# Patient Record
Sex: Male | Born: 1960 | Hispanic: No | Marital: Single | Smoking: Never smoker
Health system: Southern US, Community
[De-identification: ages and names within clinical notes are randomized; demographics above are authoritative.]

## PROBLEM LIST (undated history)

## (undated) DIAGNOSIS — Z8619 Personal history of other infectious and parasitic diseases: Secondary | ICD-10-CM

## (undated) HISTORY — DX: Personal history of other infectious and parasitic diseases: Z86.19

## (undated) HISTORY — PX: TONSILLECTOMY: SUR1361

---

## 1999-01-22 HISTORY — PX: LASIK: SHX215

## 2004-02-02 ENCOUNTER — Encounter: Admission: RE | Admit: 2004-02-02 | Discharge: 2004-03-02 | Payer: Self-pay | Admitting: Orthopaedic Surgery

## 2014-07-08 ENCOUNTER — Other Ambulatory Visit: Payer: Self-pay | Admitting: Physician Assistant

## 2014-07-08 DIAGNOSIS — N5082 Scrotal pain: Secondary | ICD-10-CM

## 2014-07-12 ENCOUNTER — Ambulatory Visit: Payer: 59

## 2014-07-12 ENCOUNTER — Other Ambulatory Visit: Payer: Self-pay | Admitting: Physician Assistant

## 2014-07-12 ENCOUNTER — Ambulatory Visit (INDEPENDENT_AMBULATORY_CARE_PROVIDER_SITE_OTHER): Payer: 59

## 2014-07-12 DIAGNOSIS — N508 Other specified disorders of male genital organs: Secondary | ICD-10-CM

## 2014-07-12 DIAGNOSIS — N5082 Scrotal pain: Secondary | ICD-10-CM

## 2014-07-13 ENCOUNTER — Other Ambulatory Visit: Payer: Self-pay

## 2016-01-22 DIAGNOSIS — Z8619 Personal history of other infectious and parasitic diseases: Secondary | ICD-10-CM

## 2016-01-22 HISTORY — DX: Personal history of other infectious and parasitic diseases: Z86.19

## 2016-02-05 DIAGNOSIS — B029 Zoster without complications: Secondary | ICD-10-CM | POA: Diagnosis not present

## 2016-02-23 DIAGNOSIS — B0223 Postherpetic polyneuropathy: Secondary | ICD-10-CM | POA: Diagnosis not present

## 2016-06-25 IMAGING — US US SCROTUM
1 series · 14 of 25 positions shown · non-contrast
Comparison: None.

CLINICAL DATA: Intermittent dull bilateral testicular pain.
Symptoms for 2-3 months.

EXAM:
SCROTAL ULTRASOUND
DOPPLER ULTRASOUND OF THE TESTICLES
TECHNIQUE: Complete ultrasound examination of the testicles, epididymis, and
other scrotal structures was performed. Color and spectral Doppler
ultrasound were also utilized to evaluate blood flow to the
testicles.

[Series 1: us scrotum · 0.07mm/px · 14 of 91 slices shown]
[im 1/91]
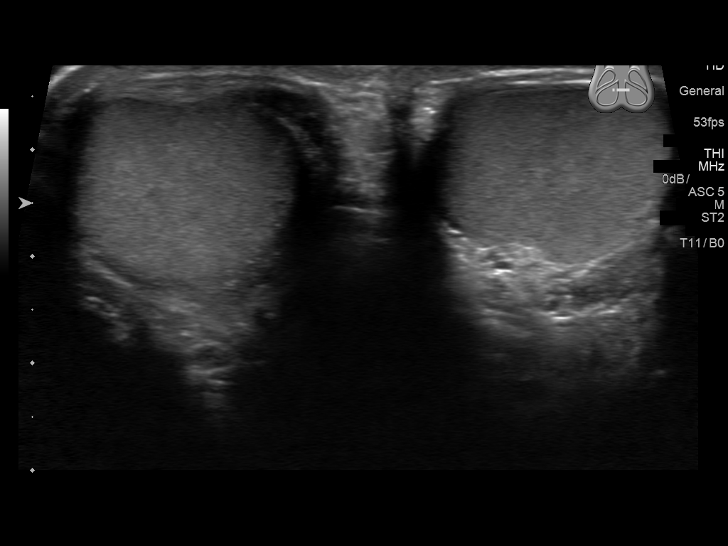
[im 8/91]
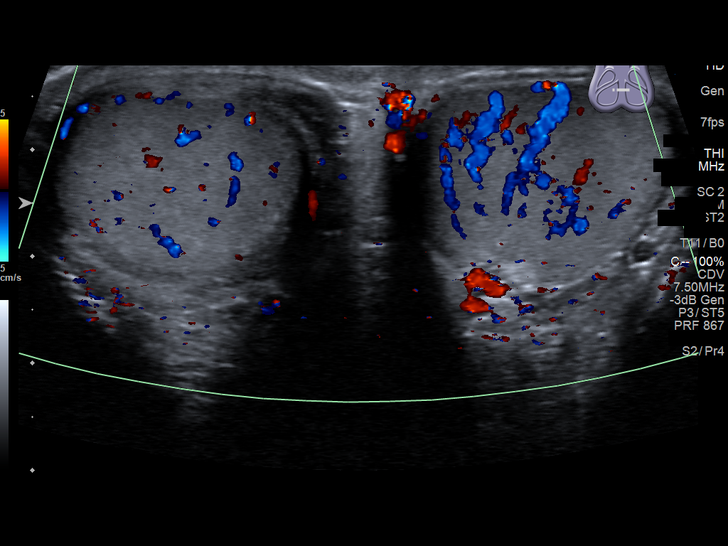
[im 16/91]
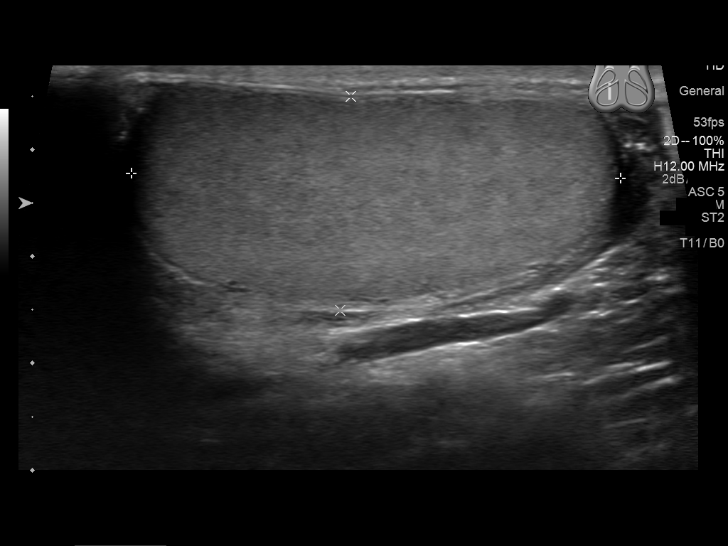
[im 23/91]
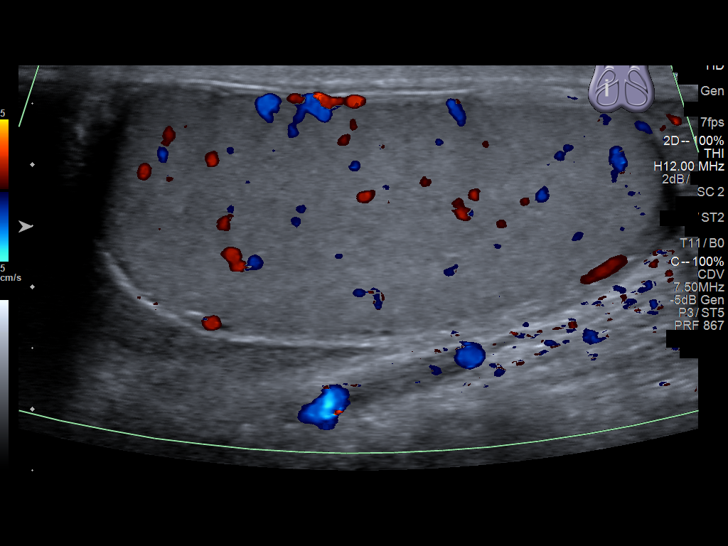
[im 31/91]
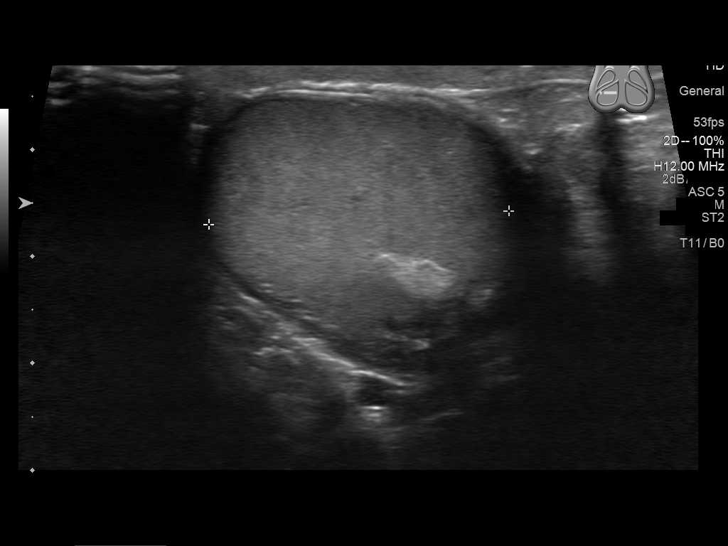
[im 34/91]
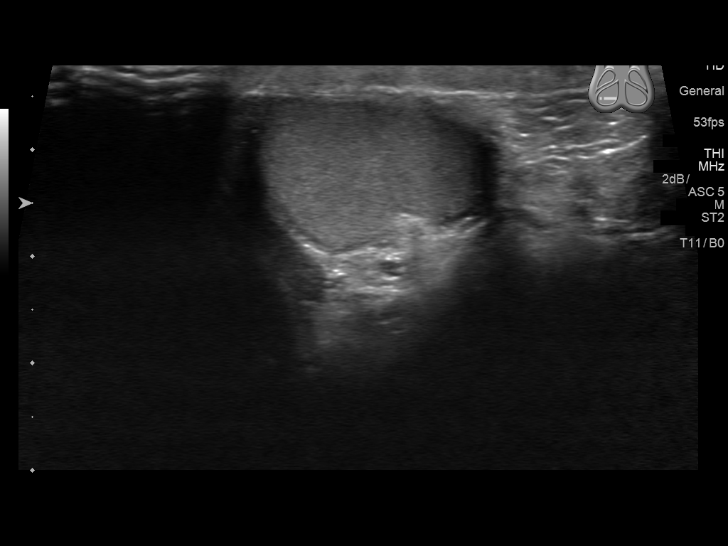
[im 42/91]
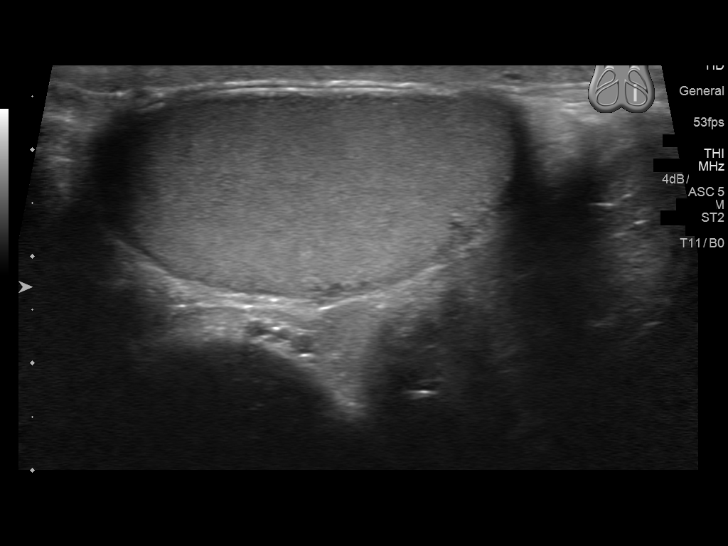
[im 49/91]
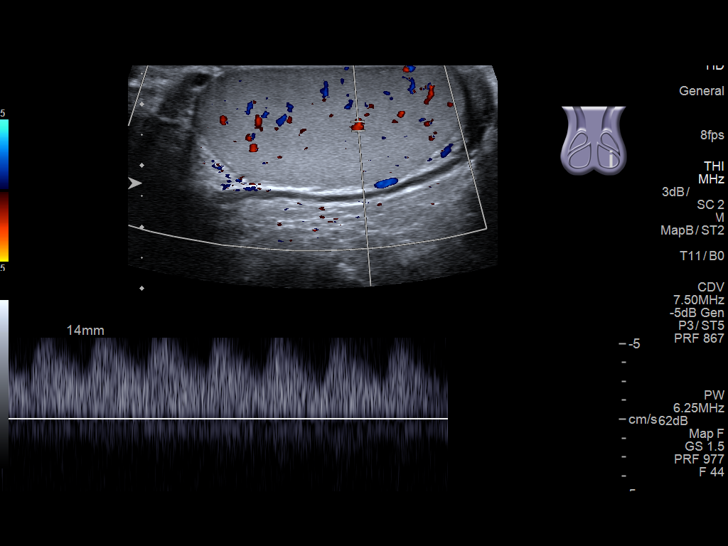
[im 57/91]
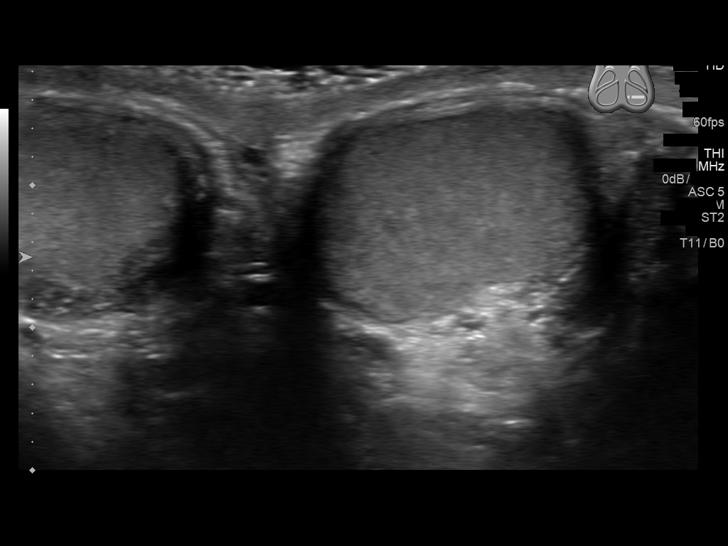
[im 61/91]
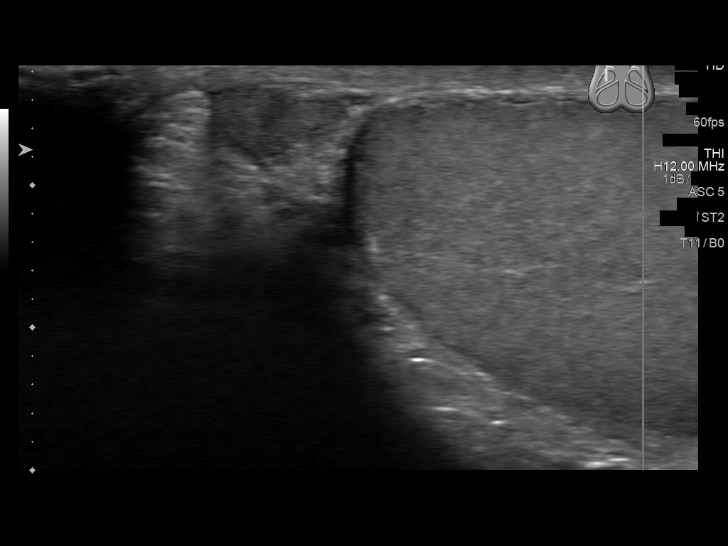
[im 68/91]
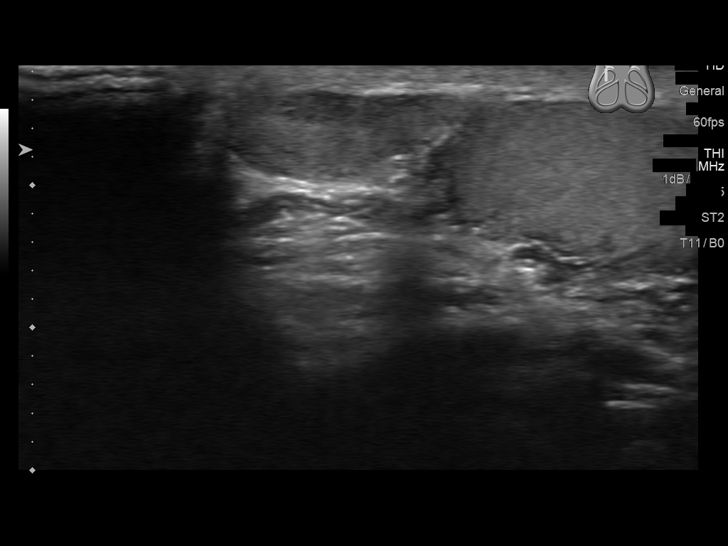
[im 76/91]
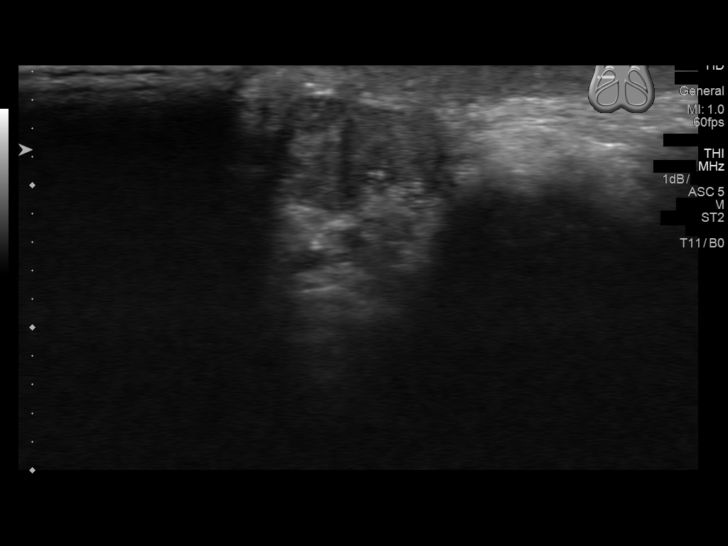
[im 83/91]
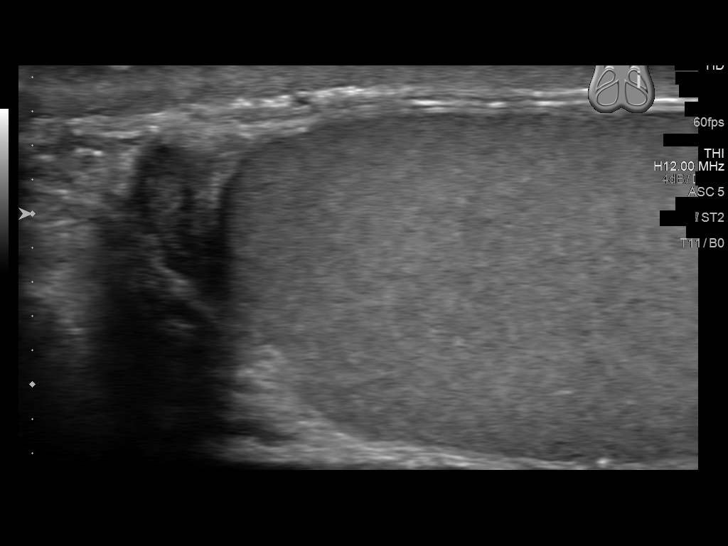
[im 91/91]
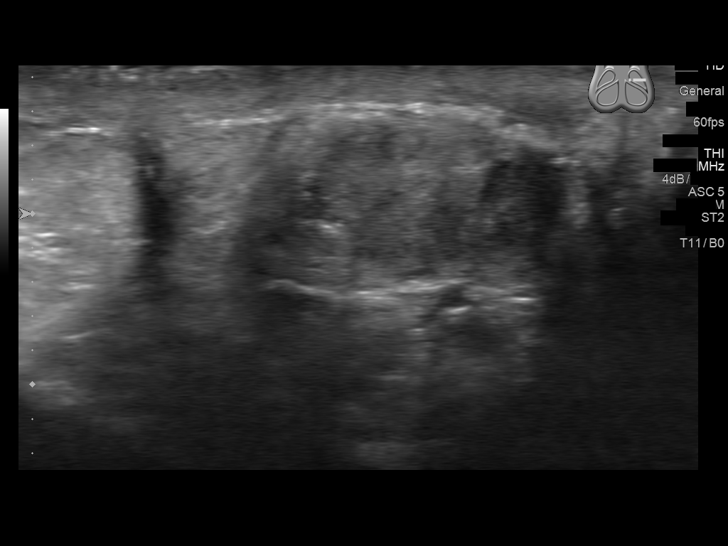

[14 of 25 positions shown; findings below may reference images not displayed]

FINDINGS: Right testicle

Measurements: 4.6 x 2.0 x 2.8 cm. No mass or microlithiasis
visualized.

Left testicle

Measurements: 4.3 x 2.3 x 2.4 cm. No mass or microlithiasis
visualized.

Right epididymis:  Normal in size and appearance.

Left epididymis:  Normal in size and appearance.

Hydrocele:  None visualized.

Varicocele:  None visualized.

Pulsed Doppler interrogation of both testes demonstrates normal low
resistance arterial and venous waveforms bilaterally.
IMPRESSION: Unremarkable testicular ultrasound.

## 2016-07-22 ENCOUNTER — Encounter: Payer: Self-pay | Admitting: Gastroenterology

## 2016-09-26 ENCOUNTER — Ambulatory Visit (AMBULATORY_SURGERY_CENTER): Payer: Self-pay | Admitting: *Deleted

## 2016-09-26 ENCOUNTER — Encounter: Payer: Self-pay | Admitting: Gastroenterology

## 2016-09-26 VITALS — Ht 72.0 in | Wt 200.0 lb

## 2016-09-26 DIAGNOSIS — Z1211 Encounter for screening for malignant neoplasm of colon: Secondary | ICD-10-CM

## 2016-09-26 MED ORDER — NA SULFATE-K SULFATE-MG SULF 17.5-3.13-1.6 GM/177ML PO SOLN: ORAL | 0 refills | Status: DC

## 2016-09-26 NOTE — Progress Notes (Signed)
Patient denies any allergies to eggs or soy. Patient denies any problems with anesthesia/sedation. Patient denies any oxygen use at home and does not take any diet/weight loss medications. EMMI education assisgned to patient on colonoscopy, this was explained and instructions given to patient. 

## 2016-10-10 ENCOUNTER — Encounter: Payer: Self-pay | Admitting: Gastroenterology

## 2016-10-10 ENCOUNTER — Ambulatory Visit (AMBULATORY_SURGERY_CENTER): Payer: 59 | Admitting: Gastroenterology

## 2016-10-10 VITALS — BP 116/79 | HR 63 | Temp 96.8°F | Resp 17 | Ht 72.0 in | Wt 200.0 lb

## 2016-10-10 DIAGNOSIS — Z1211 Encounter for screening for malignant neoplasm of colon: Secondary | ICD-10-CM

## 2016-10-10 DIAGNOSIS — Z1212 Encounter for screening for malignant neoplasm of rectum: Secondary | ICD-10-CM

## 2016-10-10 MED ORDER — SODIUM CHLORIDE 0.9 % IV SOLN: 500.0000 mL | INTRAVENOUS | Status: AC

## 2016-10-10 NOTE — Progress Notes (Signed)
To PACU VSS. Report to [email or web address] 

## 2016-10-10 NOTE — Progress Notes (Signed)
Pt's states no medical or surgical changes since previsit or office visit. 

## 2016-10-10 NOTE — Patient Instructions (Signed)

## 2016-10-10 NOTE — Op Note (Signed)
[facility] Patient Name: Benjamin Travis Procedure Date: 10/10/2016 9:23 AM MRN: [id number] Endoscopist: Napoleon Form , MD Age: 56 Referring MD:  Date of Birth: 17-Jun-1960 Gender: Male Account #: [id number] Procedure:                Colonoscopy Indications:              Screening for colorectal malignant neoplasm Medicines:                Monitored Anesthesia Care Procedure:                Pre-Anesthesia Assessment:                           - Prior to the procedure, a History and Physical                            was performed, and patient medications and                            allergies were reviewed. The patient's tolerance of                            previous anesthesia was also reviewed. The risks                            and benefits of the procedure and the sedation                            options and risks were discussed with the patient.                            All questions were answered, and informed consent                            was obtained. Prior Anticoagulants: The patient has                            taken no previous anticoagulant or antiplatelet                            agents. ASA Grade Assessment: I - A normal, healthy                            patient. After reviewing the risks and benefits,                            the patient was deemed in satisfactory condition to                            undergo the procedure.                           After obtaining informed consent, the colonoscope  was passed under direct vision. Throughout the                            procedure, the patient's blood pressure, pulse, and                            oxygen saturations were monitored continuously. The                            Colonoscope was introduced through the anus and                            advanced to the the cecum, identified by                            appendiceal orifice and ileocecal  valve. The                            colonoscopy was performed without difficulty. The                            patient tolerated the procedure well. The quality                            of the bowel preparation was excellent. The                            ileocecal valve, appendiceal orifice, and rectum                            were photographed. Scope In: 9:26:15 AM Scope Out: 9:45:10 AM Scope Withdrawal Time: 0 hours 12 minutes 52 seconds  Total Procedure Duration: 0 hours 18 minutes 55 seconds  Findings:                 The perianal and digital rectal examinations were                            normal.                           Non-bleeding internal hemorrhoids were found during                            retroflexion. The hemorrhoids were small.                           The exam was otherwise without abnormality. Complications:            No immediate complications. Estimated Blood Loss:     Estimated blood loss: none. Impression:               - Non-bleeding internal hemorrhoids.                           - The examination was otherwise normal.                           -  No specimens collected. Recommendation:           - Patient has a contact number available for                            emergencies. The signs and symptoms of potential                            delayed complications were discussed with the                            patient. Return to normal activities tomorrow.                            Written discharge instructions were provided to the                            patient.                           - Resume previous diet.                           - Continue present medications.                           - Repeat colonoscopy in 10 years for screening                            purposes. Napoleon Form, MD 10/10/2016 9:50:15 AM This report has been signed electronically.

## 2016-10-11 ENCOUNTER — Telehealth: Payer: Self-pay | Admitting: *Deleted

## 2016-10-11 NOTE — Telephone Encounter (Signed)
  Follow up Call-  Call back number 10/10/2016  Post procedure Call Back phone  # [phone]  Permission to leave phone message Yes  Some recent data might be hidden     Patient questions:  Do you have a fever, pain , or abdominal swelling? No. Pain Score  0 *  Have you tolerated food without any problems? Yes.    Have you been able to return to your normal activities? Yes.    Do you have any questions about your discharge instructions: Diet   No. Medications  No. Follow up visit  No.  Do you have questions or concerns about your Care? No.  Actions: * If pain score is 4 or above: No action needed, pain <4.

## 2016-11-18 DIAGNOSIS — Z23 Encounter for immunization: Secondary | ICD-10-CM | POA: Diagnosis not present

## 2016-12-05 DIAGNOSIS — Z23 Encounter for immunization: Secondary | ICD-10-CM | POA: Diagnosis not present

## 2017-01-27 ENCOUNTER — Ambulatory Visit (INDEPENDENT_AMBULATORY_CARE_PROVIDER_SITE_OTHER): Payer: 59 | Admitting: Orthopaedic Surgery

## 2017-01-27 ENCOUNTER — Ambulatory Visit (INDEPENDENT_AMBULATORY_CARE_PROVIDER_SITE_OTHER): Payer: 59

## 2017-01-27 ENCOUNTER — Encounter (INDEPENDENT_AMBULATORY_CARE_PROVIDER_SITE_OTHER): Payer: Self-pay | Admitting: Orthopaedic Surgery

## 2017-01-27 DIAGNOSIS — M25511 Pain in right shoulder: Secondary | ICD-10-CM

## 2017-01-27 MED ORDER — NABUMETONE 500 MG PO TABS: 500.0000 mg | ORAL_TABLET | Freq: Two times a day (BID) | ORAL | 1 refills | Status: AC | PRN

## 2017-01-27 MED ORDER — LIDOCAINE HCL 1 % IJ SOLN
3.0000 mL | INTRAMUSCULAR | Status: AC | PRN
  Administered 2017-01-27: 3 mL

## 2017-01-27 MED ORDER — METHYLPREDNISOLONE ACETATE 40 MG/ML IJ SUSP
40.0000 mg | INTRAMUSCULAR | Status: AC | PRN
  Administered 2017-01-27: 40 mg via INTRA_ARTICULAR

## 2017-01-27 NOTE — Progress Notes (Signed)
Office Visit Note   Patient: Benjamin HooseDennis Lemieux           Date of Birth: 07/31/1960           MRN: [id number] Visit Date: 01/27/2017              Requested by: Lovenia KimHepler, Mark, PA-C [id number] [place], [place] [address] PCP: Lovenia KimHepler, Mark, PA-C   Assessment & Plan: Visit Diagnoses:  1. Acute pain of right shoulder     Plan: Given the fact that a steroid injections help with his left side in the past he like to have this in the right shoulder and I agree with this.  He understands fully the risk and benefits of steroid injections.  He tolerated the injection well.  We will also try some Relafen as an anti-inflammatory.  He will follow-up as needed if things are not getting better he would let us know because I would then obtain an MRI if needed.  Follow-Up Instructions: Return if symptoms worsen or fail to improve.   Orders:  Orders Placed This Encounter  Procedures  . Large Joint Inj  . XR Shoulder Right   Meds ordered this encounter  Medications  . nabumetone (RELAFEN) 500 MG tablet    Sig: Take 1 tablet (500 mg total) by mouth 2 (two) times daily as needed.    Dispense:  60 tablet    Refill:  1      Procedures: Large Joint Inj: R subacromial bursa on 01/27/2017 3:24 PM Indications: pain and diagnostic evaluation Details: 22 G 1.5 in needle  Arthrogram: No  Medications: 3 mL lidocaine 1 %; 40 mg methylPREDNISolone acetate 40 MG/ML Outcome: tolerated well, no immediate complications Procedure, treatment alternatives, risks and benefits explained, specific risks discussed. Consent was given by the patient. Immediately prior to procedure a time out was called to verify the correct patient, procedure, equipment, support staff and site/side marked as required. Patient was prepped and draped in the usual sterile fashion.       Clinical Data: No additional findings.   Subjective: Chief Complaint  Patient presents with  . Right Shoulder - Pain  The patient comes in  today with about 1/02-3945-month history of worsening right shoulder pain with no known injury.  Of seen him a long time ago for his left shoulder and a steroid injection calm things down significantly.  He is wondering if he also try that again today.  He denies any weakness but is been definitely painful to him reach behind and reaching with overhead activities.  This is affecting his work as well.  It wakes him up at night.  He denies any neck pain he denies any numbness and tingling in his right hand.  HPI  Review of Systems He currently denies any headache, chest pain, shortness of breath, fever, chills, nausea, vomiting.  Objective: Vital Signs: There were no vitals taken for this visit.  Physical Exam He is alert and oriented x3 and in no acute distress Ortho Exam Examination of his right shoulder shows positive Neer and Hawkins denies but otherwise a well located shoulder with full range of motion but significant pain past 90 degrees of abduction.  Rotator cuff feels to be 5 out of 5 strength. Specialty Comments:  No specialty comments available.  Imaging: Xr Shoulder Right  Result Date: 01/27/2017 3 views of the right shoulder show well located shoulder with no acute findings.  There is slight decrease in the subacromial outlet.  PMFS History: There are no active problems to display for this patient.  Past Medical History:  Diagnosis Date  . History of shingles 01/2016    Family History  Problem Relation Age of Onset  . Breast cancer Mother   . Heart disease Father   . Colon cancer Neg Hx     Past Surgical History:  Procedure Laterality Date  . LASIK  2001  . TONSILLECTOMY     Social History   Occupational History  . Not on file  Tobacco Use  . Smoking status: Never Smoker  . Smokeless tobacco: Never Used  Substance and Sexual Activity  . Alcohol use: Yes    Comment: 4-5 times per week  . Drug use: No  . Sexual activity: Not on file

## 2017-03-06 DIAGNOSIS — Z23 Encounter for immunization: Secondary | ICD-10-CM | POA: Diagnosis not present

## 2017-06-18 DIAGNOSIS — Z23 Encounter for immunization: Secondary | ICD-10-CM | POA: Diagnosis not present

## 2017-06-18 DIAGNOSIS — Z0000 Encounter for general adult medical examination without abnormal findings: Secondary | ICD-10-CM | POA: Diagnosis not present

## 2017-08-01 DIAGNOSIS — R7309 Other abnormal glucose: Secondary | ICD-10-CM | POA: Diagnosis not present

## 2017-11-03 DIAGNOSIS — Z23 Encounter for immunization: Secondary | ICD-10-CM | POA: Diagnosis not present

## 2022-09-24 ENCOUNTER — Other Ambulatory Visit: Payer: Self-pay | Admitting: Family Medicine

## 2022-09-24 DIAGNOSIS — Z8249 Family history of ischemic heart disease and other diseases of the circulatory system: Secondary | ICD-10-CM

## 2022-10-07 ENCOUNTER — Other Ambulatory Visit: Payer: 59

## 2022-10-23 ENCOUNTER — Ambulatory Visit
Admission: RE | Admit: 2022-10-23 | Discharge: 2022-10-23 | Disposition: A | Discharge status: Home / Self Care | Payer: No Typology Code available for payment source | Source: Ambulatory Visit | Attending: Family Medicine | Admitting: Family Medicine

## 2022-10-23 DIAGNOSIS — Z8249 Family history of ischemic heart disease and other diseases of the circulatory system: Secondary | ICD-10-CM

## 2023-02-11 ENCOUNTER — Other Ambulatory Visit: Payer: Self-pay | Admitting: Otolaryngology

## 2023-02-11 DIAGNOSIS — H903 Sensorineural hearing loss, bilateral: Secondary | ICD-10-CM

## 2023-02-11 DIAGNOSIS — H9120 Sudden idiopathic hearing loss, unspecified ear: Secondary | ICD-10-CM

## 2023-02-12 ENCOUNTER — Other Ambulatory Visit: Payer: Self-pay | Admitting: Family Medicine

## 2023-02-12 DIAGNOSIS — R911 Solitary pulmonary nodule: Secondary | ICD-10-CM

## 2023-02-18 ENCOUNTER — Other Ambulatory Visit: Payer: 59

## 2023-02-27 ENCOUNTER — Ambulatory Visit
Admission: RE | Admit: 2023-02-27 | Discharge: 2023-02-27 | Disposition: A | Discharge status: Home / Self Care | Payer: 59 | Source: Ambulatory Visit | Attending: Family Medicine | Admitting: Family Medicine

## 2023-02-27 DIAGNOSIS — R911 Solitary pulmonary nodule: Secondary | ICD-10-CM

## 2023-03-11 ENCOUNTER — Ambulatory Visit
Admission: RE | Admit: 2023-03-11 | Discharge: 2023-03-11 | Disposition: A | Discharge status: Home / Self Care | Payer: 59 | Source: Ambulatory Visit | Attending: Otolaryngology | Admitting: Otolaryngology

## 2023-03-11 DIAGNOSIS — H903 Sensorineural hearing loss, bilateral: Secondary | ICD-10-CM

## 2023-03-11 DIAGNOSIS — H9120 Sudden idiopathic hearing loss, unspecified ear: Secondary | ICD-10-CM

## 2023-03-11 MED ORDER — GADOPICLENOL 0.5 MMOL/ML IV SOLN
10.0000 mL | Freq: Once | INTRAVENOUS | Status: AC | PRN
  Administered 2023-03-11: 10 mL via INTRAVENOUS

## 2023-03-18 ENCOUNTER — Other Ambulatory Visit (HOSPITAL_COMMUNITY): Payer: Self-pay | Admitting: Family Medicine

## 2023-03-18 DIAGNOSIS — R911 Solitary pulmonary nodule: Secondary | ICD-10-CM

## 2023-03-24 ENCOUNTER — Encounter (HOSPITAL_COMMUNITY)
Admission: RE | Admit: 2023-03-24 | Discharge: 2023-03-24 | Disposition: A | Discharge status: Home / Self Care | Payer: 59 | Source: Ambulatory Visit | Attending: Family Medicine | Admitting: Family Medicine

## 2023-03-24 DIAGNOSIS — R911 Solitary pulmonary nodule: Secondary | ICD-10-CM | POA: Diagnosis present

## 2023-03-24 LAB — GLUCOSE, CAPILLARY: Glucose-Capillary: 119 mg/dL — ABNORMAL HIGH (ref 70–99)

## 2023-03-24 MED ORDER — FLUDEOXYGLUCOSE F - 18 (FDG) INJECTION
10.0000 | Freq: Once | INTRAVENOUS | Status: AC | PRN
  Administered 2023-03-24: 10.198 via INTRAVENOUS
